# Patient Record
Sex: Male | Born: 2000 | Race: White | Hispanic: No | Marital: Single | Smoking: Never smoker
Health system: Southern US, Community
[De-identification: ages and names within clinical notes are randomized; demographics above are authoritative.]

## PROBLEM LIST (undated history)

## (undated) HISTORY — PX: TYMPANOSTOMY TUBE PLACEMENT: SHX32

---
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
## 2019-11-23 DIAGNOSIS — Z419 Encounter for procedure for purposes other than remedying health state, unspecified: Secondary | ICD-10-CM | POA: Diagnosis not present

## 2019-12-01 DIAGNOSIS — Z20822 Contact with and (suspected) exposure to covid-19: Secondary | ICD-10-CM | POA: Diagnosis not present

## 2019-12-24 DIAGNOSIS — Z419 Encounter for procedure for purposes other than remedying health state, unspecified: Secondary | ICD-10-CM | POA: Diagnosis not present

## 2020-01-23 DIAGNOSIS — Z419 Encounter for procedure for purposes other than remedying health state, unspecified: Secondary | ICD-10-CM | POA: Diagnosis not present

## 2020-02-09 ENCOUNTER — Other Ambulatory Visit: Payer: Self-pay

## 2020-02-09 DIAGNOSIS — M25462 Effusion, left knee: Secondary | ICD-10-CM

## 2020-02-12 ENCOUNTER — Ambulatory Visit: Payer: Medicaid Other

## 2020-02-19 DIAGNOSIS — M25462 Effusion, left knee: Secondary | ICD-10-CM | POA: Diagnosis not present

## 2020-02-23 DIAGNOSIS — Z419 Encounter for procedure for purposes other than remedying health state, unspecified: Secondary | ICD-10-CM | POA: Diagnosis not present

## 2020-02-25 DIAGNOSIS — M25362 Other instability, left knee: Secondary | ICD-10-CM | POA: Diagnosis not present

## 2020-02-25 DIAGNOSIS — F17290 Nicotine dependence, other tobacco product, uncomplicated: Secondary | ICD-10-CM | POA: Diagnosis not present

## 2020-02-25 DIAGNOSIS — S83512A Sprain of anterior cruciate ligament of left knee, initial encounter: Secondary | ICD-10-CM | POA: Diagnosis not present

## 2020-03-24 DIAGNOSIS — Z419 Encounter for procedure for purposes other than remedying health state, unspecified: Secondary | ICD-10-CM | POA: Diagnosis not present

## 2020-04-16 DIAGNOSIS — J029 Acute pharyngitis, unspecified: Secondary | ICD-10-CM | POA: Diagnosis not present

## 2020-04-24 DIAGNOSIS — Z419 Encounter for procedure for purposes other than remedying health state, unspecified: Secondary | ICD-10-CM | POA: Diagnosis not present

## 2020-05-25 DIAGNOSIS — Z419 Encounter for procedure for purposes other than remedying health state, unspecified: Secondary | ICD-10-CM | POA: Diagnosis not present

## 2020-06-03 DIAGNOSIS — S83512D Sprain of anterior cruciate ligament of left knee, subsequent encounter: Secondary | ICD-10-CM | POA: Diagnosis not present

## 2020-06-22 DIAGNOSIS — Z419 Encounter for procedure for purposes other than remedying health state, unspecified: Secondary | ICD-10-CM | POA: Diagnosis not present

## 2020-07-23 DIAGNOSIS — Z419 Encounter for procedure for purposes other than remedying health state, unspecified: Secondary | ICD-10-CM | POA: Diagnosis not present

## 2020-08-10 DIAGNOSIS — S83512D Sprain of anterior cruciate ligament of left knee, subsequent encounter: Secondary | ICD-10-CM | POA: Diagnosis not present

## 2020-08-22 DIAGNOSIS — Z419 Encounter for procedure for purposes other than remedying health state, unspecified: Secondary | ICD-10-CM | POA: Diagnosis not present

## 2020-09-22 DIAGNOSIS — Z419 Encounter for procedure for purposes other than remedying health state, unspecified: Secondary | ICD-10-CM | POA: Diagnosis not present

## 2020-10-22 DIAGNOSIS — Z419 Encounter for procedure for purposes other than remedying health state, unspecified: Secondary | ICD-10-CM | POA: Diagnosis not present

## 2020-11-22 DIAGNOSIS — Z419 Encounter for procedure for purposes other than remedying health state, unspecified: Secondary | ICD-10-CM | POA: Diagnosis not present

## 2020-12-23 DIAGNOSIS — Z419 Encounter for procedure for purposes other than remedying health state, unspecified: Secondary | ICD-10-CM | POA: Diagnosis not present

## 2021-01-22 DIAGNOSIS — Z419 Encounter for procedure for purposes other than remedying health state, unspecified: Secondary | ICD-10-CM | POA: Diagnosis not present

## 2021-02-22 DIAGNOSIS — Z419 Encounter for procedure for purposes other than remedying health state, unspecified: Secondary | ICD-10-CM | POA: Diagnosis not present

## 2021-03-24 DIAGNOSIS — Z419 Encounter for procedure for purposes other than remedying health state, unspecified: Secondary | ICD-10-CM | POA: Diagnosis not present

## 2021-04-24 DIAGNOSIS — Z419 Encounter for procedure for purposes other than remedying health state, unspecified: Secondary | ICD-10-CM | POA: Diagnosis not present

## 2021-05-25 DIAGNOSIS — Z419 Encounter for procedure for purposes other than remedying health state, unspecified: Secondary | ICD-10-CM | POA: Diagnosis not present

## 2021-06-22 DIAGNOSIS — Z419 Encounter for procedure for purposes other than remedying health state, unspecified: Secondary | ICD-10-CM | POA: Diagnosis not present

## 2021-07-23 DIAGNOSIS — Z419 Encounter for procedure for purposes other than remedying health state, unspecified: Secondary | ICD-10-CM | POA: Diagnosis not present

## 2021-08-22 DIAGNOSIS — Z419 Encounter for procedure for purposes other than remedying health state, unspecified: Secondary | ICD-10-CM | POA: Diagnosis not present

## 2021-09-22 DIAGNOSIS — Z419 Encounter for procedure for purposes other than remedying health state, unspecified: Secondary | ICD-10-CM | POA: Diagnosis not present

## 2021-10-22 DIAGNOSIS — Z419 Encounter for procedure for purposes other than remedying health state, unspecified: Secondary | ICD-10-CM | POA: Diagnosis not present

## 2021-11-22 DIAGNOSIS — Z419 Encounter for procedure for purposes other than remedying health state, unspecified: Secondary | ICD-10-CM | POA: Diagnosis not present

## 2021-12-23 DIAGNOSIS — Z419 Encounter for procedure for purposes other than remedying health state, unspecified: Secondary | ICD-10-CM | POA: Diagnosis not present

## 2022-01-13 ENCOUNTER — Telehealth: Payer: Self-pay

## 2022-01-13 NOTE — Telephone Encounter (Signed)
Patient called to connect with a Primary Care Provider. Patient has Managed Medicaid. LVM for patient to call [phone] to discuss making an appoint with a Primary Care Provider. If patient returns call, please reach out to Sarah or Claris Pech, RN.   

## 2022-01-22 DIAGNOSIS — Z419 Encounter for procedure for purposes other than remedying health state, unspecified: Secondary | ICD-10-CM | POA: Diagnosis not present

## 2022-01-24 ENCOUNTER — Telehealth: Payer: Self-pay

## 2022-01-24 NOTE — Telephone Encounter (Signed)
Patient called to connect with a Primary Care Provider. Unable to leave VM, VM not set up/VM full. Patient has Managed Medicaid. If patient returns call, please reach out to Sarah or Caleb Decock, RN.   

## 2022-01-27 ENCOUNTER — Telehealth: Payer: Self-pay

## 2022-01-27 NOTE — Telephone Encounter (Signed)
Patient called to connect with a Primary Care Provider. Unable to leave VM, VM not set up/VM full. Patient has Managed Medicaid. If patient returns call, please reach out to Agamjot Kilgallon or Leslie, RN.   

## 2022-02-22 DIAGNOSIS — Z419 Encounter for procedure for purposes other than remedying health state, unspecified: Secondary | ICD-10-CM | POA: Diagnosis not present

## 2022-03-24 DIAGNOSIS — Z419 Encounter for procedure for purposes other than remedying health state, unspecified: Secondary | ICD-10-CM | POA: Diagnosis not present

## 2022-04-24 DIAGNOSIS — Z419 Encounter for procedure for purposes other than remedying health state, unspecified: Secondary | ICD-10-CM | POA: Diagnosis not present

## 2022-05-25 DIAGNOSIS — Z419 Encounter for procedure for purposes other than remedying health state, unspecified: Secondary | ICD-10-CM | POA: Diagnosis not present

## 2022-06-23 DIAGNOSIS — Z419 Encounter for procedure for purposes other than remedying health state, unspecified: Secondary | ICD-10-CM | POA: Diagnosis not present

## 2022-07-24 DIAGNOSIS — Z419 Encounter for procedure for purposes other than remedying health state, unspecified: Secondary | ICD-10-CM | POA: Diagnosis not present

## 2022-08-23 DIAGNOSIS — Z419 Encounter for procedure for purposes other than remedying health state, unspecified: Secondary | ICD-10-CM | POA: Diagnosis not present

## 2022-09-23 DIAGNOSIS — Z419 Encounter for procedure for purposes other than remedying health state, unspecified: Secondary | ICD-10-CM | POA: Diagnosis not present

## 2022-10-23 DIAGNOSIS — Z419 Encounter for procedure for purposes other than remedying health state, unspecified: Secondary | ICD-10-CM | POA: Diagnosis not present

## 2022-11-23 DIAGNOSIS — Z419 Encounter for procedure for purposes other than remedying health state, unspecified: Secondary | ICD-10-CM | POA: Diagnosis not present

## 2022-12-24 DIAGNOSIS — Z419 Encounter for procedure for purposes other than remedying health state, unspecified: Secondary | ICD-10-CM | POA: Diagnosis not present

## 2023-01-23 DIAGNOSIS — Z419 Encounter for procedure for purposes other than remedying health state, unspecified: Secondary | ICD-10-CM | POA: Diagnosis not present

## 2023-02-23 DIAGNOSIS — Z419 Encounter for procedure for purposes other than remedying health state, unspecified: Secondary | ICD-10-CM | POA: Diagnosis not present

## 2023-03-25 DIAGNOSIS — Z419 Encounter for procedure for purposes other than remedying health state, unspecified: Secondary | ICD-10-CM | POA: Diagnosis not present
